# Patient Record
Sex: Male | Born: 1985 | Race: White | Hispanic: No | Marital: Married | Smoking: Never smoker
Health system: Southern US, Community
[De-identification: ages and names within clinical notes are randomized; demographics above are authoritative.]

## PROBLEM LIST (undated history)

## (undated) DIAGNOSIS — F419 Anxiety disorder, unspecified: Secondary | ICD-10-CM

## (undated) HISTORY — PX: WISDOM TOOTH EXTRACTION: SHX21

## (undated) HISTORY — PX: APPENDECTOMY: SHX54

## (undated) HISTORY — PX: HAND SURGERY: SHX662

---

## 2004-08-08 ENCOUNTER — Emergency Department: Payer: Self-pay | Admitting: Emergency Medicine

## 2004-08-09 ENCOUNTER — Other Ambulatory Visit: Payer: Self-pay

## 2007-05-09 ENCOUNTER — Emergency Department: Payer: Self-pay | Admitting: Emergency Medicine

## 2007-05-09 ENCOUNTER — Other Ambulatory Visit: Payer: Self-pay

## 2007-06-11 ENCOUNTER — Emergency Department: Payer: Self-pay | Admitting: Unknown Physician Specialty

## 2007-06-11 ENCOUNTER — Other Ambulatory Visit: Payer: Self-pay

## 2007-06-18 ENCOUNTER — Ambulatory Visit: Payer: Self-pay | Admitting: Gastroenterology

## 2007-06-19 ENCOUNTER — Ambulatory Visit: Payer: Self-pay | Admitting: Gastroenterology

## 2007-08-18 ENCOUNTER — Ambulatory Visit: Payer: Self-pay | Admitting: Otolaryngology

## 2016-08-03 ENCOUNTER — Other Ambulatory Visit: Payer: Self-pay | Admitting: Orthopedic Surgery

## 2016-08-03 DIAGNOSIS — M25512 Pain in left shoulder: Secondary | ICD-10-CM

## 2016-08-03 DIAGNOSIS — M7542 Impingement syndrome of left shoulder: Secondary | ICD-10-CM

## 2016-08-03 DIAGNOSIS — G8929 Other chronic pain: Secondary | ICD-10-CM

## 2017-08-12 ENCOUNTER — Encounter: Payer: Self-pay | Admitting: Emergency Medicine

## 2017-08-12 ENCOUNTER — Emergency Department: Payer: BLUE CROSS/BLUE SHIELD

## 2017-08-12 ENCOUNTER — Emergency Department
Admission: EM | Admit: 2017-08-12 | Discharge: 2017-08-12 | Disposition: A | Discharge status: Home / Self Care | Payer: BLUE CROSS/BLUE SHIELD | Attending: Emergency Medicine | Admitting: Emergency Medicine

## 2017-08-12 DIAGNOSIS — Y999 Unspecified external cause status: Secondary | ICD-10-CM | POA: Diagnosis not present

## 2017-08-12 DIAGNOSIS — Y929 Unspecified place or not applicable: Secondary | ICD-10-CM | POA: Diagnosis not present

## 2017-08-12 DIAGNOSIS — W0110XA Fall on same level from slipping, tripping and stumbling with subsequent striking against unspecified object, initial encounter: Secondary | ICD-10-CM | POA: Insufficient documentation

## 2017-08-12 DIAGNOSIS — S0990XA Unspecified injury of head, initial encounter: Secondary | ICD-10-CM

## 2017-08-12 DIAGNOSIS — Y939 Activity, unspecified: Secondary | ICD-10-CM | POA: Diagnosis not present

## 2017-08-12 MED ORDER — NAPROXEN 500 MG PO TABS: 500.0000 mg | ORAL_TABLET | Freq: Two times a day (BID) | ORAL | Status: DC

## 2017-08-12 MED ORDER — TRAMADOL HCL 50 MG PO TABS
50.0000 mg | ORAL_TABLET | Freq: Once | ORAL | Status: AC
  Administered 2017-08-12: 50 mg via ORAL
  Filled 2017-08-12: qty 1

## 2017-08-12 MED ORDER — NAPROXEN 500 MG PO TABS
500.0000 mg | ORAL_TABLET | Freq: Once | ORAL | Status: AC
  Administered 2017-08-12: 500 mg via ORAL
  Filled 2017-08-12: qty 1

## 2017-08-12 MED ORDER — BACITRACIN ZINC 500 UNIT/GM EX OINT
TOPICAL_OINTMENT | Freq: Once | CUTANEOUS | Status: AC
Start: 2017-08-12 — End: 2017-08-12
  Administered 2017-08-12: 1 via TOPICAL
  Filled 2017-08-12: qty 1.8

## 2017-08-12 NOTE — ED Triage Notes (Signed)
Pt reports slipped down his steps this am and hit the back of his head and right arm. Pt states it was 2 steps that he slipped down. Pt with abrasion to right arm. No obvious injury to head. Pt denies LOC.

## 2017-08-12 NOTE — ED Notes (Signed)
Bacitracin applied to right forearm and sterile dressing placed over.

## 2017-08-12 NOTE — ED Provider Notes (Signed)
[facility] Emergency Department Provider Note   ____________________________________________   First MD Initiated Contact with Patient 08/12/17 1003     (approximate)  I have reviewed the triage vital signs and the nursing notes.   HISTORY  Chief Complaint Head Injury; Arm Injury; and Fall    HPI Juan Berry is a 32 y.o. male patient slipped and fell and hit the back of his head.  Patient also complained of right arm pain secondary to fall.  Patient denies LOC.  In triage patient denies headache.  After arriving to the exam room he states he has an increase in posterior  Headache posterior neck pain.  Patient denies vision disturbance or vertigo.  Patient rates his pain as a 2/10.  History reviewed. No pertinent past medical history.  There are no active problems to display for this patient.   History reviewed. No pertinent surgical history.  Prior to Admission medications   Medication Sig Start Date End Date Taking? Authorizing Provider  clonazePAM (KLONOPIN) 0.5 MG tablet Take 0.5 mg by mouth 2 (two) times daily as needed for anxiety.   Yes [provider]  naproxen (NAPROSYN) 500 MG tablet Take 1 tablet (500 mg total) by mouth 2 (two) times daily with a meal. 08/12/17   Joni Reining, PA-C    Allergies Patient has no known allergies.  No family history on file.  Social History Social History   Tobacco Use  . Smoking status: Not on file  Substance Use Topics  . Alcohol use: Not on file  . Drug use: Not on file    Review of Systems Constitutional: No fever/chills Eyes: No visual changes. ENT: No sore throat. Cardiovascular: Denies chest pain. Respiratory: Denies shortness of breath. Gastrointestinal: No abdominal pain.  No nausea, no vomiting.  No diarrhea.  No constipation. Genitourinary: Negative for dysuria. Musculoskeletal: Right forearm pain. Skin: Negative for rash.  Abrasion to right forearm. Neurological:  Positive for headaches, but denies focal weakness or numbness.   ____________________________________________   PHYSICAL EXAM:  VITAL SIGNS: ED Triage Vitals  Enc Vitals Group     BP 08/12/17 0913 124/78     Pulse Rate 08/12/17 0913 74     Resp 08/12/17 0913 20     Temp 08/12/17 0913 98 F (36.7 C)     Temp Source 08/12/17 0913 Oral     SpO2 08/12/17 0915 100 %     Weight 08/12/17 0915 210 lb (95.3 kg)     Height 08/12/17 0915 6' (1.829 m)     Head Circumference --      Peak Flow --      Pain Score 08/12/17 0915 2     Pain Loc --      Pain Edu? --      Excl. in GC? --    Constitutional: Alert and oriented. Well appearing and in no acute distress. Eyes: Conjunctivae are normal. PERRL. EOMI. Head: Atraumatic. Neck: No stridor.  No cervical spine tenderness to palpation. Hematological/Lymphatic/Immunilogical: No cervical lymphadenopathy. Cardiovascular: Normal rate, regular rhythm. Grossly normal heart sounds.  Good peripheral circulation. Respiratory: Normal respiratory effort.  No retractions. Lungs CTAB. Gastrointestinal: Soft and nontender. No distention. No abdominal bruits. No CVA tenderness. Musculoskeletal: No obvious deformities of the right upper extremity.  Moderate guarding palpation mid shaft for the right forearm. Neurologic:  Normal speech and language. No gross focal neurologic deficits are appreciated. No gait instability. Skin:  Skin is warm, dry and intact. No rash  noted.  Abrasion right forearm. Psychiatric: Mood and affect are normal. Speech and behavior are normal.  ____________________________________________   LABS (all labs ordered are listed, but only abnormal results are displayed)  Labs Reviewed - No data to display ____________________________________________  EKG   ____________________________________________  RADIOLOGY  No acute findings on CT of the head and neck.  No acute findings on x-ray of the right forearm.  Official  radiology report(s): Dg Forearm Right  Result Date: 08/12/2017 CLINICAL DATA:  Right forearm abrasion after fall. EXAM: RIGHT FOREARM - 2 VIEW COMPARISON:  None. FINDINGS: There is no evidence of fracture or other focal bone lesions. Soft tissues are unremarkable. IMPRESSION: Negative. Electronically Signed   By: Obie Dredge M.D.   On: 08/12/2017 10:54   Ct Head Wo Contrast  Result Date: 08/12/2017 CLINICAL DATA:  Fall down steps with headaches and neck pain, initial encounter EXAM: CT HEAD WITHOUT CONTRAST CT CERVICAL SPINE WITHOUT CONTRAST TECHNIQUE: Multidetector CT imaging of the head and cervical spine was performed following the standard protocol without intravenous contrast. Multiplanar CT image reconstructions of the cervical spine were also generated. COMPARISON:  06/11/2007 FINDINGS: CT HEAD FINDINGS Brain: No evidence of acute infarction, hemorrhage, hydrocephalus, extra-axial collection or mass lesion/mass effect. Vascular: No hyperdense vessel or unexpected calcification. Skull: Normal. Negative for fracture or focal lesion. Sinuses/Orbits: No acute finding. Other: None. CT CERVICAL SPINE FINDINGS Alignment: Within normal limits. Skull base and vertebrae: 7 cervical segments are well visualized. Vertebral body height is well maintained. No acute fracture or acute facet abnormality is identified. Soft tissues and spinal canal: No soft tissue abnormality is identified. Upper chest: Within normal limits. Other: None IMPRESSION: CT of the head: Normal head CT. CT of the cervical spine: No acute abnormality noted. Electronically Signed   By: Alcide Clever M.D.   On: 08/12/2017 11:04   Ct Cervical Spine Wo Contrast  Result Date: 08/12/2017 CLINICAL DATA:  Fall down steps with headaches and neck pain, initial encounter EXAM: CT HEAD WITHOUT CONTRAST CT CERVICAL SPINE WITHOUT CONTRAST TECHNIQUE: Multidetector CT imaging of the head and cervical spine was performed following the standard protocol  without intravenous contrast. Multiplanar CT image reconstructions of the cervical spine were also generated. COMPARISON:  06/11/2007 FINDINGS: CT HEAD FINDINGS Brain: No evidence of acute infarction, hemorrhage, hydrocephalus, extra-axial collection or mass lesion/mass effect. Vascular: No hyperdense vessel or unexpected calcification. Skull: Normal. Negative for fracture or focal lesion. Sinuses/Orbits: No acute finding. Other: None. CT CERVICAL SPINE FINDINGS Alignment: Within normal limits. Skull base and vertebrae: 7 cervical segments are well visualized. Vertebral body height is well maintained. No acute fracture or acute facet abnormality is identified. Soft tissues and spinal canal: No soft tissue abnormality is identified. Upper chest: Within normal limits. Other: None IMPRESSION: CT of the head: Normal head CT. CT of the cervical spine: No acute abnormality noted. Electronically Signed   By: Alcide Clever M.D.   On: 08/12/2017 11:04    ____________________________________________   PROCEDURES  Procedure(s) performed: None  Procedures  Critical Care performed: No  ____________________________________________   INITIAL IMPRESSION / ASSESSMENT AND PLAN / ED COURSE  As part of my medical decision making, I reviewed the following data within the electronic MEDICAL RECORD NUMBER    Patient presents with headache, neck and right arm pain secondary to fall.  Discussed negative findings of imaging with patient.  Patient given discharge care instructions.  Patient advised return right ED if condition worsens.     ____________________________________________  FINAL CLINICAL IMPRESSION(S) / ED DIAGNOSES  Final diagnoses:  Minor head injury, initial encounter     ED Discharge Orders        Ordered    naproxen (NAPROSYN) 500 MG tablet  2 times daily with meals     08/12/17 1120       Note:  This document was prepared using Dragon voice recognition software and may include  unintentional dictation errors.    Joni Reining, PA-C 08/12/17 1122    Don Perking, [place], MD 08/12/17 [phone]

## 2017-08-12 NOTE — ED Triage Notes (Signed)
Pt reports pain to arm but states no head pain now.

## 2017-08-12 NOTE — ED Notes (Signed)
See triage note  States he slipped down steps  Hit head ,lower back and abrasion noted to right elbow area

## 2019-01-22 ENCOUNTER — Other Ambulatory Visit: Payer: Self-pay

## 2019-01-22 DIAGNOSIS — Z20822 Contact with and (suspected) exposure to covid-19: Secondary | ICD-10-CM

## 2019-01-24 LAB — NOVEL CORONAVIRUS, NAA: SARS-CoV-2, NAA: NOT DETECTED

## 2019-03-25 ENCOUNTER — Other Ambulatory Visit: Payer: Self-pay | Admitting: Orthopedic Surgery

## 2019-04-05 IMAGING — DX DG FOREARM 2V*R*
2 series · 2 of 2 positions shown · non-contrast
Comparison: None.

CLINICAL DATA: Right forearm abrasion after fall.

EXAM:
RIGHT FOREARM - 2 VIEW

[forearm ap]
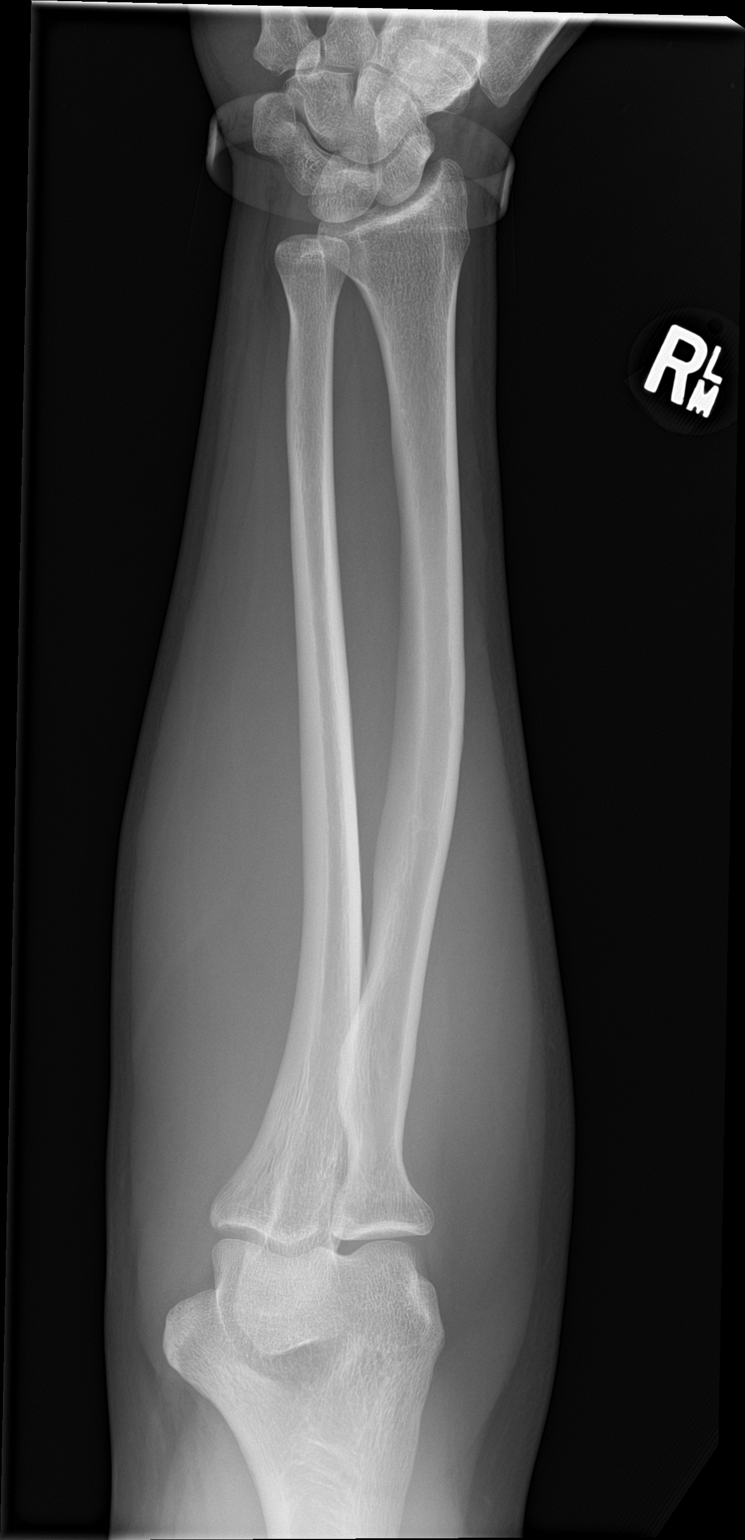

[forearm lat]
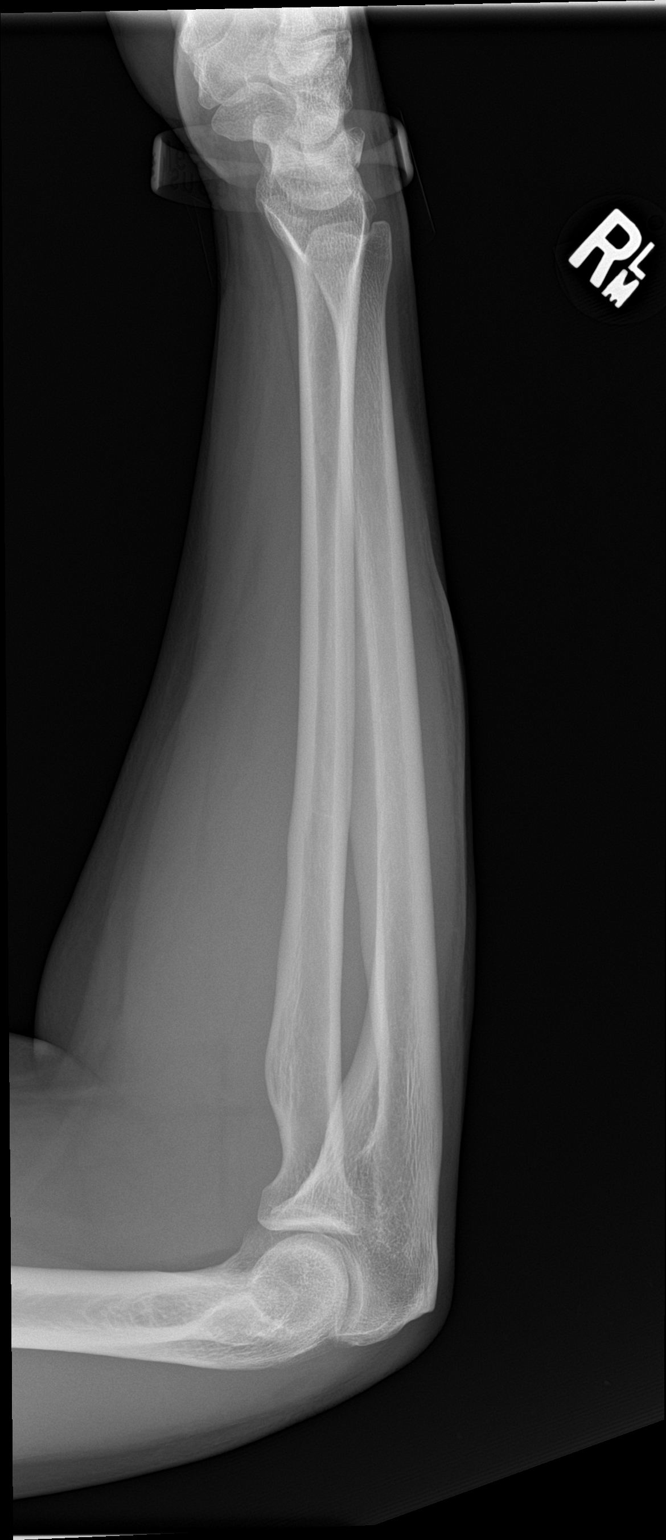

[2 of 2 positions shown; findings below may reference images not displayed]

FINDINGS: There is no evidence of fracture or other focal bone lesions. Soft
tissues are unremarkable.
IMPRESSION: Negative.

## 2019-04-05 IMAGING — CT CT CERVICAL SPINE W/O CM
4 of 7 series · 13 of 33 positions shown, 14 images · non-contrast
Comparison: 06/11/2007

CLINICAL DATA: Fall down steps with headaches and neck pain,
initial encounter

EXAM:
CT HEAD WITHOUT CONTRAST
CT CERVICAL SPINE WITHOUT CONTRAST
TECHNIQUE: Multidetector CT imaging of the head and cervical spine was
performed following the standard protocol without intravenous
contrast. Multiplanar CT image reconstructions of the cervical spine
were also generated.

[Series 7: c spine soft · axial · 0.29mm/px · z∈[-274,-194]mm · 3 of 101 slices shown]
[im 21/101  soft-tissue]
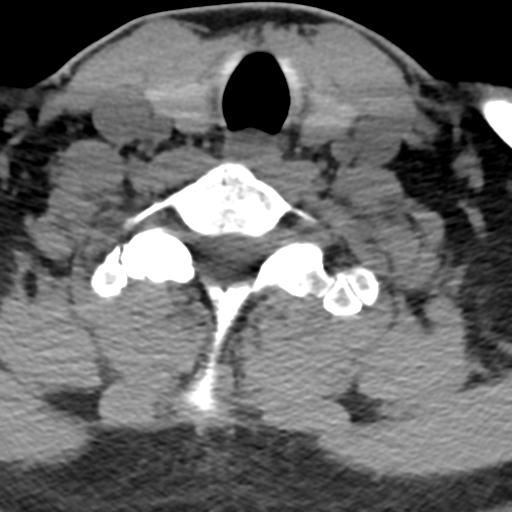
[im 41/101  soft-tissue]
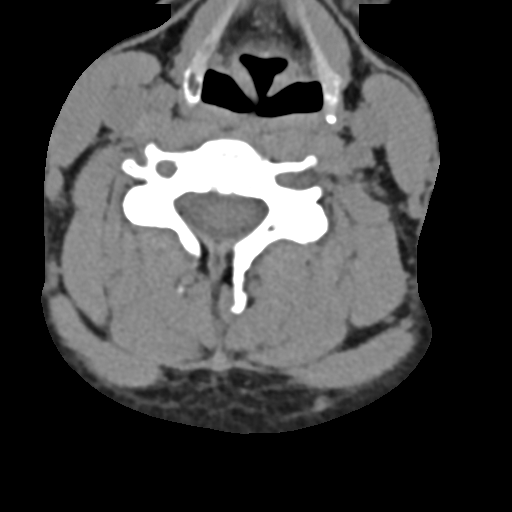
[im 61/101  soft-tissue]
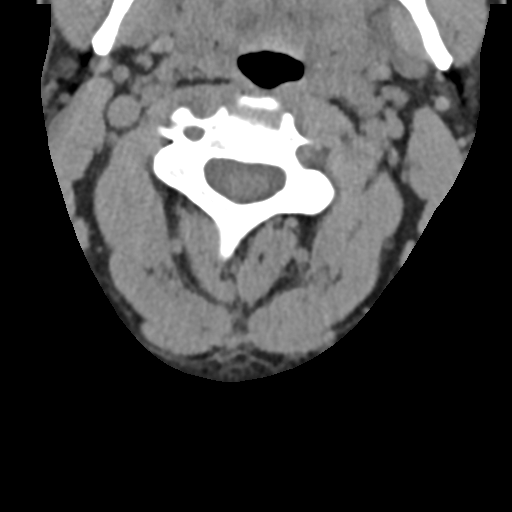

[Series 10: sagittal bone · sagittal · 0.23mm/px · 5 of 55 slices shown]
[im 10/55  bone]
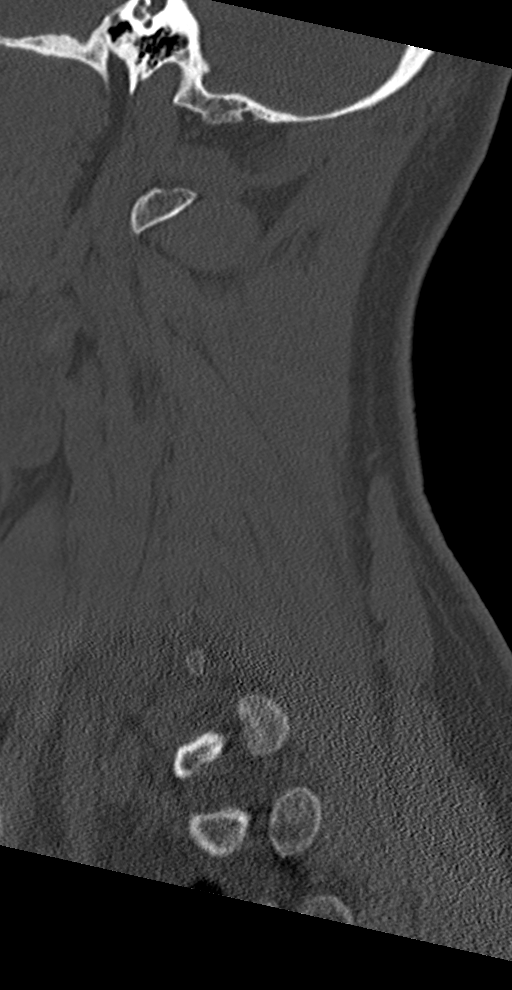
[im 19/55  bone]
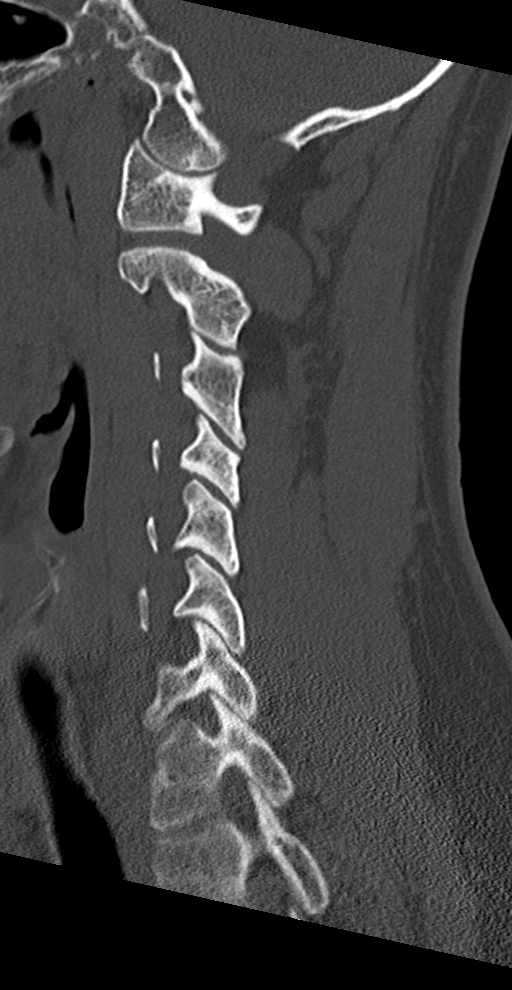
[im 28/55  bone]
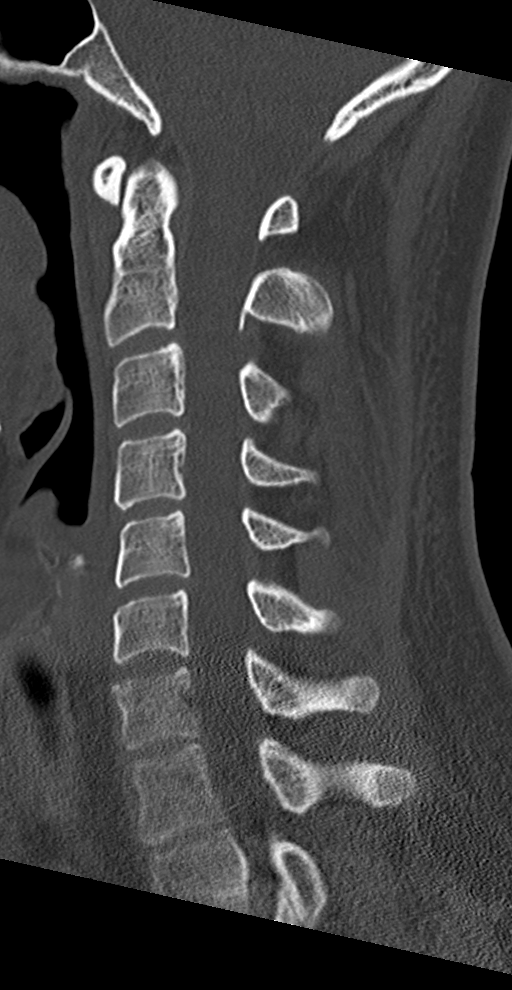
[im 37/55  bone]
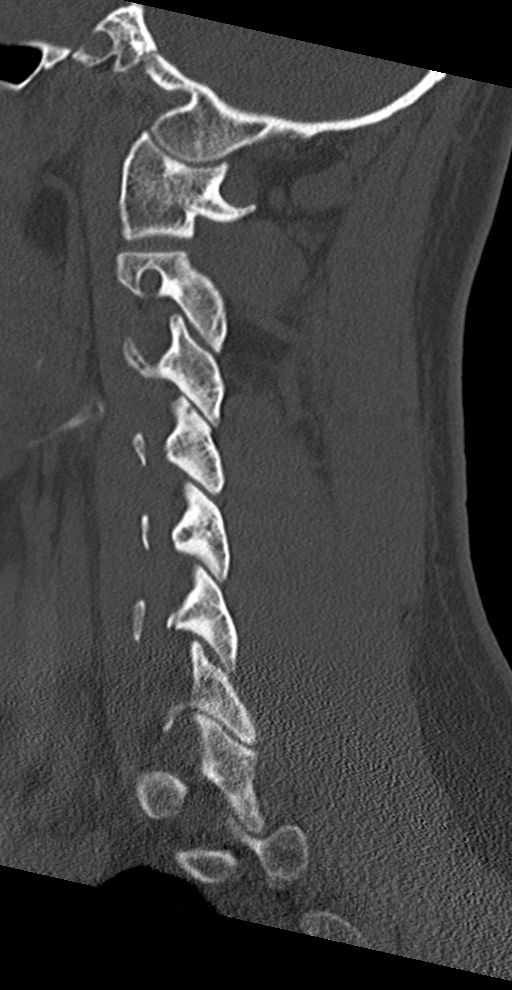
[im 46/55  bone]
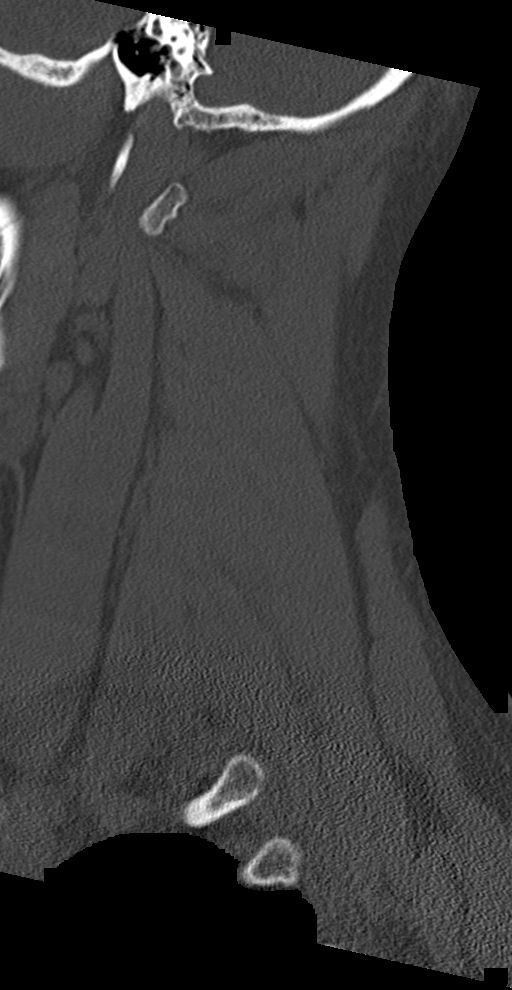

[Series 11: coronal bone · coronal · 0.25mm/px · 1 of 51 slices shown]
[im 26/51  bone]
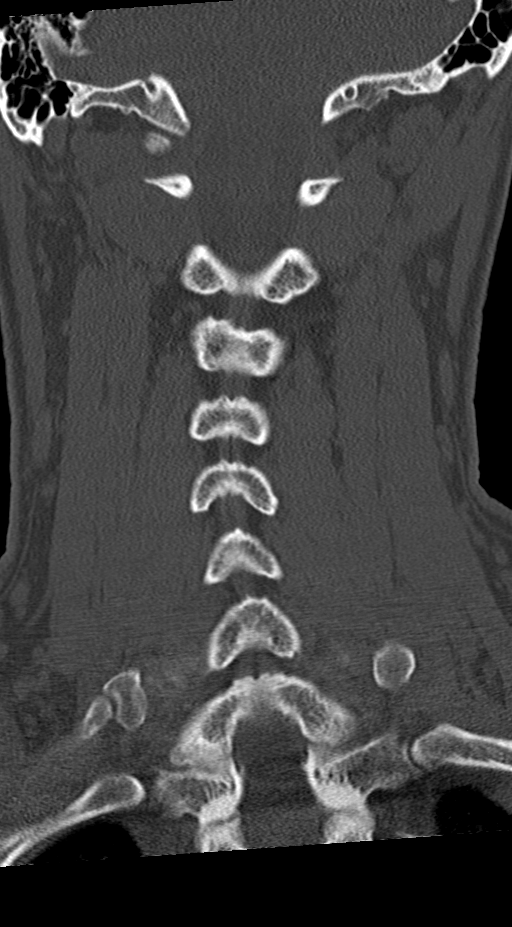

[Series 12: orthogonal bone · axial · 0.22mm/px · z∈[-299,-173]mm · 4 of 111 slices shown, 5 images]
[im 23/111  soft-tissue]
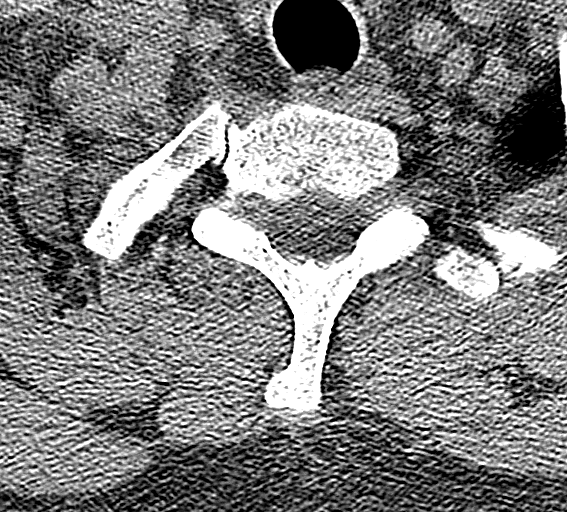
[im 23/111  bone]
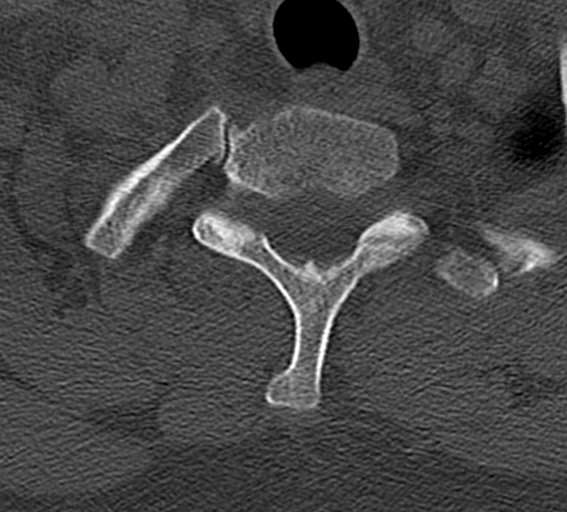
[im 45/111  bone]
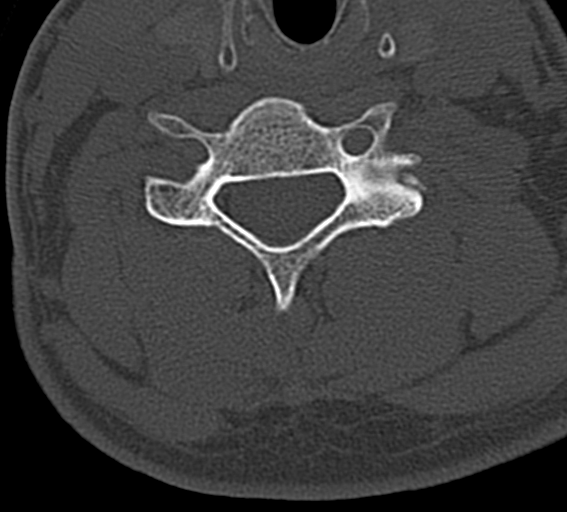
[im 67/111  bone]
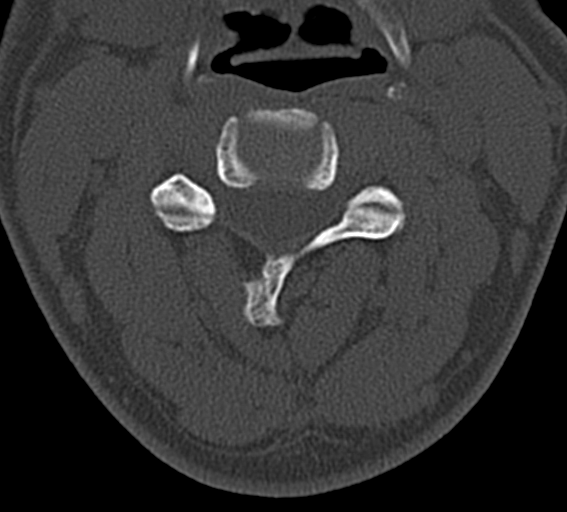
[im 89/111  bone]
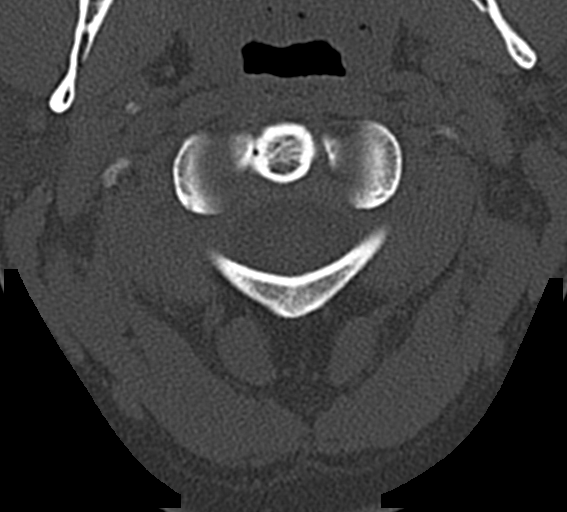

[13 of 33 positions shown; findings below may reference images not displayed]

FINDINGS: CT HEAD FINDINGS

Brain: No evidence of acute infarction, hemorrhage, hydrocephalus,
extra-axial collection or mass lesion/mass effect.

Vascular: No hyperdense vessel or unexpected calcification.

Skull: Normal. Negative for fracture or focal lesion.

Sinuses/Orbits: No acute finding.

Other: None.

CT CERVICAL SPINE FINDINGS

Alignment: Within normal limits.

Skull base and vertebrae: 7 cervical segments are well visualized.
Vertebral body height is well maintained. No acute fracture or acute
facet abnormality is identified.

Soft tissues and spinal canal: No soft tissue abnormality is
identified.

Upper chest: Within normal limits.

Other: None
IMPRESSION: CT of the head: Normal head CT.

CT of the cervical spine: No acute abnormality noted.

## 2019-04-22 ENCOUNTER — Ambulatory Visit: Payer: BC Managed Care – PPO | Attending: Internal Medicine

## 2019-04-22 DIAGNOSIS — Z20822 Contact with and (suspected) exposure to covid-19: Secondary | ICD-10-CM

## 2019-04-23 LAB — NOVEL CORONAVIRUS, NAA: SARS-CoV-2, NAA: NOT DETECTED

## 2019-05-04 ENCOUNTER — Other Ambulatory Visit: Payer: Self-pay

## 2019-05-04 ENCOUNTER — Encounter
Admission: RE | Admit: 2019-05-04 | Discharge: 2019-05-04 | Disposition: A | Discharge status: Home / Self Care | Payer: BC Managed Care – PPO | Source: Ambulatory Visit | Attending: Orthopedic Surgery | Admitting: Orthopedic Surgery

## 2019-05-04 DIAGNOSIS — Z01812 Encounter for preprocedural laboratory examination: Secondary | ICD-10-CM | POA: Diagnosis present

## 2019-05-04 DIAGNOSIS — Z20822 Contact with and (suspected) exposure to covid-19: Secondary | ICD-10-CM | POA: Diagnosis not present

## 2019-05-04 HISTORY — DX: Anxiety disorder, unspecified: F41.9

## 2019-05-04 NOTE — Patient Instructions (Signed)
Your procedure is scheduled on: 05/07/19 Report to [facility]. To find out your arrival time please call [phone] between 1PM - 3PM on 05/06/19.  Remember: Instructions that are not followed completely may result in serious medical risk, up to and including death, or upon the discretion of your surgeon and anesthesiologist your surgery may need to be rescheduled.     _X__ 1. Do not eat food after midnight the night before your procedure.                 No gum chewing or hard candies. You may drink clear liquids up to 2 hours                 before you are scheduled to arrive for your surgery- DO not drink clear                 liquids within 2 hours of the start of your surgery.                 Clear Liquids include:  water, apple juice without pulp, clear carbohydrate                 drink such as Clearfast or Gatorade, Black Coffee or Tea (Do not add                 anything to coffee or tea). Diabetics water only  __X__2.  On the morning of surgery brush your teeth with toothpaste and water, you                 may rinse your mouth with mouthwash if you wish.  Do not swallow any              toothpaste of mouthwash.     _X__ 3.  No Alcohol for 24 hours before or after surgery.   _X__ 4.  Do Not Smoke or use e-cigarettes For 24 Hours Prior to Your Surgery.                 Do not use any chewable tobacco products for at least 6 hours prior to                 surgery.  ____  5.  Bring all medications with you on the day of surgery if instructed.   __X__  6.  Notify your doctor if there is any change in your medical condition      (cold, fever, infections).     Do not wear jewelry, make-up, hairpins, clips or nail polish. Do not wear lotions, powders, or perfumes.  Do not shave 48 hours prior to surgery. Men may shave face and neck. Do not bring valuables to the hospital.    [facility] is not responsible for any belongings or  valuables.  Contacts, dentures/partials or body piercings may not be worn into surgery. Bring a case for your contacts, glasses or hearing aids, a denture cup will be supplied. Leave your suitcase in the car. After surgery it may be brought to your room. For patients admitted to the hospital, discharge time is determined by your treatment team.   Patients discharged the day of surgery will not be allowed to drive home.   Please read over the following fact sheets that you were given:   MRSA Information  __X__ Take these medicines the morning of surgery with A SIP OF WATER:  1. Clonazepam  2. Zoloft  3. Effexor  4.  5.  6.  ____ Fleet Enema (as directed)   __X__ Use CHG Soap/SAGE wipes as directed  ____ Use inhalers on the day of surgery  ____ Stop metformin/Janumet/Farxiga 2 days prior to surgery    ____ Take 1/2 of usual insulin dose the night before surgery. No insulin the morning          of surgery.   ____ Stop Blood Thinners Coumadin/Plavix/Xarelto/Pleta/Pradaxa/Eliquis/Effient/Aspirin  on   Or contact your Surgeon, Cardiologist or Medical Doctor regarding  ability to stop your blood thinners  __X__ Stop Anti-inflammatories 7 days before surgery such as Advil, Ibuprofen, Motrin,  BC or Goodies Powder, Naprosyn, Naproxen, Aleve, Aspirin    __X__ Stop all herbal supplements, fish oil or vitamin E until after surgery.    ____ Bring C-Pap to the hospital.

## 2019-05-05 ENCOUNTER — Other Ambulatory Visit
Admission: RE | Admit: 2019-05-05 | Discharge: 2019-05-05 | Disposition: A | Discharge status: Home / Self Care | Payer: BC Managed Care – PPO | Source: Ambulatory Visit | Attending: Orthopedic Surgery | Admitting: Orthopedic Surgery

## 2019-05-05 DIAGNOSIS — Z01812 Encounter for preprocedural laboratory examination: Secondary | ICD-10-CM | POA: Diagnosis not present

## 2019-05-05 LAB — CBC WITH DIFFERENTIAL/PLATELET
Abs Immature Granulocytes: 0.01 10*3/uL (ref 0.00–0.07)
Basophils Absolute: 0 10*3/uL (ref 0.0–0.1)
Basophils Relative: 0 %
Eosinophils Absolute: 0.1 10*3/uL (ref 0.0–0.5)
Eosinophils Relative: 1 %
HCT: 48.3 % (ref 39.0–52.0)
Hemoglobin: 16.2 g/dL (ref 13.0–17.0)
Immature Granulocytes: 0 %
Lymphocytes Relative: 33 %
Lymphs Abs: 1.6 10*3/uL (ref 0.7–4.0)
MCH: 29 pg (ref 26.0–34.0)
MCHC: 33.5 g/dL (ref 30.0–36.0)
MCV: 86.6 fL (ref 80.0–100.0)
Monocytes Absolute: 0.4 10*3/uL (ref 0.1–1.0)
Monocytes Relative: 8 %
Neutro Abs: 2.8 10*3/uL (ref 1.7–7.7)
Neutrophils Relative %: 58 %
Platelets: 313 10*3/uL (ref 150–400)
RBC: 5.58 MIL/uL (ref 4.22–5.81)
RDW: 12.6 % (ref 11.5–15.5)
WBC: 4.9 10*3/uL (ref 4.0–10.5)
nRBC: 0 % (ref 0.0–0.2)

## 2019-05-05 LAB — APTT: aPTT: 33 seconds (ref 24–36)

## 2019-05-05 LAB — BASIC METABOLIC PANEL
Anion gap: 10 (ref 5–15)
BUN: 9 mg/dL (ref 6–20)
CO2: 27 mmol/L (ref 22–32)
Calcium: 9.7 mg/dL (ref 8.9–10.3)
Chloride: 103 mmol/L (ref 98–111)
Creatinine, Ser: 1.11 mg/dL (ref 0.61–1.24)
GFR calc Af Amer: >60 mL/min (ref >60)
GFR calc non Af Amer: >60 mL/min (ref >60)
Glucose, Bld: 82 mg/dL (ref 70–99)
Potassium: 3.6 mmol/L (ref 3.5–5.1)
Sodium: 140 mmol/L (ref 135–145)

## 2019-05-05 LAB — PROTIME-INR
INR: 1 (ref 0.8–1.2)
Prothrombin Time: 12.8 seconds (ref 11.4–15.2)

## 2019-05-05 LAB — SARS CORONAVIRUS 2 (TAT 6-24 HRS): SARS Coronavirus 2: NEGATIVE

## 2019-05-07 ENCOUNTER — Other Ambulatory Visit: Payer: Self-pay

## 2019-05-07 ENCOUNTER — Ambulatory Visit
Admission: RE | Admit: 2019-05-07 | Discharge: 2019-05-07 | Disposition: A | Discharge status: Home / Self Care | Payer: BC Managed Care – PPO | Attending: Orthopedic Surgery | Admitting: Orthopedic Surgery

## 2019-05-07 ENCOUNTER — Ambulatory Visit: Payer: BC Managed Care – PPO | Admitting: Anesthesiology

## 2019-05-07 ENCOUNTER — Encounter: Payer: Self-pay | Admitting: Orthopedic Surgery

## 2019-05-07 ENCOUNTER — Encounter
Admission: RE | Disposition: A | Discharge status: Home / Self Care | Payer: Self-pay | Source: Home / Self Care | Attending: Orthopedic Surgery

## 2019-05-07 ENCOUNTER — Ambulatory Visit: Payer: BC Managed Care – PPO

## 2019-05-07 DIAGNOSIS — S43431A Superior glenoid labrum lesion of right shoulder, initial encounter: Secondary | ICD-10-CM | POA: Insufficient documentation

## 2019-05-07 DIAGNOSIS — F419 Anxiety disorder, unspecified: Secondary | ICD-10-CM | POA: Insufficient documentation

## 2019-05-07 DIAGNOSIS — Z419 Encounter for procedure for purposes other than remedying health state, unspecified: Secondary | ICD-10-CM

## 2019-05-07 DIAGNOSIS — X58XXXA Exposure to other specified factors, initial encounter: Secondary | ICD-10-CM | POA: Insufficient documentation

## 2019-05-07 DIAGNOSIS — Z79899 Other long term (current) drug therapy: Secondary | ICD-10-CM | POA: Diagnosis not present

## 2019-05-07 HISTORY — PX: SHOULDER ARTHROSCOPY WITH ROTATOR CUFF REPAIR AND SUBACROMIAL DECOMPRESSION: SHX5686

## 2019-05-07 SURGERY — SHOULDER ARTHROSCOPY WITH ROTATOR CUFF REPAIR AND SUBACROMIAL DECOMPRESSION
Anesthesia: General | Site: Shoulder | Laterality: Right

## 2019-05-07 MED ORDER — OXYCODONE HCL 5 MG/5ML PO SOLN: 5.0000 mg | Freq: Once | ORAL | Status: DC | PRN

## 2019-05-07 MED ORDER — FAMOTIDINE 20 MG PO TABS
20.0000 mg | ORAL_TABLET | Freq: Once | ORAL | Status: AC
  Administered 2019-05-07: 06:00:00 20 mg via ORAL

## 2019-05-07 MED ORDER — BUPIVACAINE LIPOSOME 1.3 % IJ SUSP
INTRAMUSCULAR | Status: DC | PRN
  Administered 2019-05-07: 20 mL

## 2019-05-07 MED ORDER — BUPIVACAINE HCL (PF) 0.5 % IJ SOLN
INTRAMUSCULAR | Status: AC
  Filled 2019-05-07: qty 10

## 2019-05-07 MED ORDER — ACETAMINOPHEN 10 MG/ML IV SOLN
INTRAVENOUS | Status: AC
  Filled 2019-05-07: qty 100

## 2019-05-07 MED ORDER — ROCURONIUM BROMIDE 100 MG/10ML IV SOLN
INTRAVENOUS | Status: DC | PRN
  Administered 2019-05-07 (×3): 10 mg via INTRAVENOUS
  Administered 2019-05-07: 50 mg via INTRAVENOUS

## 2019-05-07 MED ORDER — FENTANYL CITRATE (PF) 100 MCG/2ML IJ SOLN
INTRAMUSCULAR | Status: AC
  Filled 2019-05-07: qty 2

## 2019-05-07 MED ORDER — EPINEPHRINE PF 1 MG/ML IJ SOLN
INTRAMUSCULAR | Status: AC
  Filled 2019-05-07: qty 4

## 2019-05-07 MED ORDER — PROPOFOL 10 MG/ML IV BOLUS
INTRAVENOUS | Status: DC | PRN
  Administered 2019-05-07: 40 mg via INTRAVENOUS
  Administered 2019-05-07: 160 mg via INTRAVENOUS

## 2019-05-07 MED ORDER — ONDANSETRON HCL 4 MG/2ML IJ SOLN: 4.0000 mg | Freq: Once | INTRAMUSCULAR | Status: DC | PRN

## 2019-05-07 MED ORDER — MIDAZOLAM HCL 2 MG/2ML IJ SOLN
INTRAMUSCULAR | Status: AC
  Filled 2019-05-07: qty 2

## 2019-05-07 MED ORDER — OXYCODONE HCL 5 MG PO TABS: 5.0000 mg | ORAL_TABLET | ORAL | 0 refills | Status: AC | PRN

## 2019-05-07 MED ORDER — CHLORHEXIDINE GLUCONATE CLOTH 2 % EX PADS
6.0000 | MEDICATED_PAD | Freq: Once | CUTANEOUS | Status: AC
  Administered 2019-05-07: 07:00:00 6 via TOPICAL

## 2019-05-07 MED ORDER — MIDAZOLAM HCL 2 MG/2ML IJ SOLN
INTRAMUSCULAR | Status: AC
  Administered 2019-05-07: 1 mg via INTRAVENOUS
  Filled 2019-05-07: qty 2

## 2019-05-07 MED ORDER — EPHEDRINE SULFATE 50 MG/ML IJ SOLN
INTRAMUSCULAR | Status: DC | PRN
  Administered 2019-05-07 (×2): 10 mg via INTRAVENOUS

## 2019-05-07 MED ORDER — FENTANYL CITRATE (PF) 100 MCG/2ML IJ SOLN
INTRAMUSCULAR | Status: AC
  Administered 2019-05-07: 07:00:00 50 ug via INTRAVENOUS
  Filled 2019-05-07: qty 2

## 2019-05-07 MED ORDER — PROPOFOL 10 MG/ML IV BOLUS
INTRAVENOUS | Status: AC
  Filled 2019-05-07: qty 20

## 2019-05-07 MED ORDER — PHENYLEPHRINE HCL (PRESSORS) 10 MG/ML IV SOLN
INTRAVENOUS | Status: DC | PRN
  Administered 2019-05-07: 100 ug via INTRAVENOUS
  Administered 2019-05-07: 180 ug via INTRAVENOUS
  Administered 2019-05-07 (×3): 100 ug via INTRAVENOUS

## 2019-05-07 MED ORDER — LIDOCAINE HCL (CARDIAC) PF 100 MG/5ML IV SOSY
PREFILLED_SYRINGE | INTRAVENOUS | Status: DC | PRN
  Administered 2019-05-07: 60 mg via INTRAVENOUS

## 2019-05-07 MED ORDER — MIDAZOLAM HCL 2 MG/2ML IJ SOLN
1.0000 mg | INTRAMUSCULAR | Status: AC | PRN
  Administered 2019-05-07: 08:00:00 1 mg via INTRAVENOUS

## 2019-05-07 MED ORDER — FENTANYL CITRATE (PF) 100 MCG/2ML IJ SOLN
INTRAMUSCULAR | Status: DC | PRN
  Administered 2019-05-07: 25 ug via INTRAVENOUS
  Administered 2019-05-07 (×2): 50 ug via INTRAVENOUS

## 2019-05-07 MED ORDER — SUGAMMADEX SODIUM 200 MG/2ML IV SOLN
INTRAVENOUS | Status: AC
  Filled 2019-05-07: qty 2

## 2019-05-07 MED ORDER — FENTANYL CITRATE (PF) 100 MCG/2ML IJ SOLN: 25.0000 ug | INTRAMUSCULAR | Status: DC | PRN

## 2019-05-07 MED ORDER — LACTATED RINGERS IV SOLN
INTRAVENOUS | Status: DC | PRN
  Administered 2019-05-07: 4 mL

## 2019-05-07 MED ORDER — BUPIVACAINE LIPOSOME 1.3 % IJ SUSP
INTRAMUSCULAR | Status: AC
  Filled 2019-05-07: qty 20

## 2019-05-07 MED ORDER — FENTANYL CITRATE (PF) 100 MCG/2ML IJ SOLN: 50.0000 ug | INTRAMUSCULAR | Status: DC | PRN

## 2019-05-07 MED ORDER — CEFAZOLIN SODIUM-DEXTROSE 2-4 GM/100ML-% IV SOLN
2.0000 g | INTRAVENOUS | Status: AC
  Administered 2019-05-07: 2 g via INTRAVENOUS

## 2019-05-07 MED ORDER — ONDANSETRON HCL 4 MG/2ML IJ SOLN
INTRAMUSCULAR | Status: DC | PRN
  Administered 2019-05-07: 4 mg via INTRAVENOUS

## 2019-05-07 MED ORDER — OXYCODONE HCL 5 MG PO TABS: 5.0000 mg | ORAL_TABLET | Freq: Once | ORAL | Status: DC | PRN

## 2019-05-07 MED ORDER — CHLORHEXIDINE GLUCONATE CLOTH 2 % EX PADS: 6.0000 | MEDICATED_PAD | Freq: Once | CUTANEOUS | Status: DC

## 2019-05-07 MED ORDER — SUGAMMADEX SODIUM 200 MG/2ML IV SOLN
INTRAVENOUS | Status: DC | PRN
  Administered 2019-05-07: 200 mg via INTRAVENOUS

## 2019-05-07 MED ORDER — ONDANSETRON HCL 4 MG PO TABS: 4.0000 mg | ORAL_TABLET | Freq: Three times a day (TID) | ORAL | 0 refills | Status: AC | PRN

## 2019-05-07 MED ORDER — ACETAMINOPHEN 10 MG/ML IV SOLN
1000.0000 mg | Freq: Once | INTRAVENOUS | Status: DC | PRN
  Administered 2019-05-07: 1000 mg via INTRAVENOUS

## 2019-05-07 MED ORDER — LIDOCAINE HCL (PF) 1 % IJ SOLN
INTRAMUSCULAR | Status: AC
  Filled 2019-05-07: qty 30

## 2019-05-07 MED ORDER — LACTATED RINGERS IV SOLN: INTRAVENOUS | Status: DC

## 2019-05-07 MED ORDER — ROCURONIUM BROMIDE 50 MG/5ML IV SOLN
INTRAVENOUS | Status: AC
  Filled 2019-05-07: qty 1

## 2019-05-07 MED ORDER — BUPIVACAINE HCL (PF) 0.25 % IJ SOLN
INTRAMUSCULAR | Status: DC | PRN
  Administered 2019-05-07: 10 mg via EPIDURAL

## 2019-05-07 MED ORDER — SODIUM CHLORIDE (PF) 0.9 % IJ SOLN
INTRAMUSCULAR | Status: AC
  Filled 2019-05-07: qty 10

## 2019-05-07 SURGICAL SUPPLY — 69 items
ADAPTER IRRIG TUBE 2 SPIKE SOL (ADAPTER) ×6 IMPLANT
ANCHOR SUT BIOC ST 3X145 (Anchor) ×9 IMPLANT
BUR RADIUS 4.0X18.5 (BURR) ×3 IMPLANT
BUR RADIUS 5.5 (BURR) ×3 IMPLANT
CANISTER SUCT LVC 12 LTR MEDI- (MISCELLANEOUS) ×3 IMPLANT
CANNULA 5.75X7 CRYSTAL CLEAR (CANNULA) ×6 IMPLANT
CANNULA PARTIAL THREAD 2X7 (CANNULA) ×9 IMPLANT
CANNULA TWIST IN 8.25X9CM (CANNULA) ×6 IMPLANT
CLOSURE WOUND 1/2 X4 (GAUZE/BANDAGES/DRESSINGS) ×1
CONNECTOR PERFECT PASSER (CONNECTOR) ×6 IMPLANT
COOLER POLAR GLACIER W/PUMP (MISCELLANEOUS) ×3 IMPLANT
COVER WAND RF STERILE (DRAPES) ×3 IMPLANT
CRADLE LAMINECT ARM (MISCELLANEOUS) ×6 IMPLANT
DEVICE SUCT BLK HOLE OR FLOOR (MISCELLANEOUS) IMPLANT
DRAPE 3/4 80X56 (DRAPES) ×3 IMPLANT
DRAPE IMP U-DRAPE 54X76 (DRAPES) ×6 IMPLANT
DRAPE INCISE IOBAN 66X45 STRL (DRAPES) ×3 IMPLANT
DRAPE U-SHAPE 47X51 STRL (DRAPES) ×3 IMPLANT
DURAPREP 26ML APPLICATOR (WOUND CARE) ×9 IMPLANT
ELECT REM PT RETURN 9FT ADLT (ELECTROSURGICAL) ×3
ELECTRODE REM PT RTRN 9FT ADLT (ELECTROSURGICAL) ×1 IMPLANT
GAUZE SPONGE 4X4 12PLY STRL (GAUZE/BANDAGES/DRESSINGS) ×3 IMPLANT
GAUZE XEROFORM 1X8 LF (GAUZE/BANDAGES/DRESSINGS) ×3 IMPLANT
GLOVE BIOGEL PI IND STRL 9 (GLOVE) ×1 IMPLANT
GLOVE BIOGEL PI INDICATOR 9 (GLOVE) ×2
GLOVE SURG 9.0 ORTHO LTXF (GLOVE) ×9 IMPLANT
GOWN STRL REUS TWL 2XL XL LVL4 (GOWN DISPOSABLE) ×3 IMPLANT
GOWN STRL REUS W/ TWL LRG LVL3 (GOWN DISPOSABLE) ×1 IMPLANT
GOWN STRL REUS W/TWL LRG LVL3 (GOWN DISPOSABLE) ×2
IV LACTATED RINGER IRRG 3000ML (IV SOLUTION) ×12
IV LR IRRIG 3000ML ARTHROMATIC (IV SOLUTION) ×6 IMPLANT
KIT STABILIZATION SHOULDER (MISCELLANEOUS) ×3 IMPLANT
KIT SUTURE 2.8 Q-FIX DISP (MISCELLANEOUS) ×3 IMPLANT
KIT SUTURETAK 3.0 INSERT PERC (KITS) ×3 IMPLANT
KIT TURNOVER KIT A (KITS) ×3 IMPLANT
MANIFOLD NEPTUNE II (INSTRUMENTS) ×3 IMPLANT
MASK FACE SPIDER DISP (MASK) ×3 IMPLANT
MAT ABSORB  FLUID 56X50 GRAY (MISCELLANEOUS) ×4
MAT ABSORB FLUID 56X50 GRAY (MISCELLANEOUS) ×2 IMPLANT
NDL SAFETY ECLIPSE 18X1.5 (NEEDLE) ×1 IMPLANT
NEEDLE HYPO 18GX1.5 SHARP (NEEDLE) ×2
NEEDLE HYPO 22GX1.5 SAFETY (NEEDLE) ×3 IMPLANT
NS IRRIG 500ML POUR BTL (IV SOLUTION) ×3 IMPLANT
PACK ARTHROSCOPY SHOULDER (MISCELLANEOUS) ×3 IMPLANT
PAD WRAPON POLAR SHDR XLG (MISCELLANEOUS) ×1 IMPLANT
PASSER SUT CAPTURE FIRST (SUTURE) ×3 IMPLANT
SET TUBE SUCT SHAVER OUTFL 24K (TUBING) ×3 IMPLANT
SET TUBE TIP INTRA-ARTICULAR (MISCELLANEOUS) ×3 IMPLANT
STRIP CLOSURE SKIN 1/2X4 (GAUZE/BANDAGES/DRESSINGS) ×2 IMPLANT
SUT ETHILON 4-0 (SUTURE) ×2
SUT ETHILON 4-0 FS2 18XMFL BLK (SUTURE) ×1
SUT LASSO 90 DEG SD STR (SUTURE) ×3 IMPLANT
SUT MNCRL 4-0 (SUTURE) ×2
SUT MNCRL 4-0 27XMFL (SUTURE) ×1
SUT PDS AB 0 CT1 27 (SUTURE) ×9 IMPLANT
SUT PERFECTPASSER WHITE CART (SUTURE) IMPLANT
SUT SMART STITCH CARTRIDGE (SUTURE) IMPLANT
SUT VIC AB 0 CT1 36 (SUTURE) ×9 IMPLANT
SUT VIC AB 2-0 CT2 27 (SUTURE) ×3 IMPLANT
SUTURE ETHLN 4-0 FS2 18XMF BLK (SUTURE) ×1 IMPLANT
SUTURE MAGNUM WIRE 2X48 BLK (SUTURE) IMPLANT
SUTURE MNCRL 4-0 27XMF (SUTURE) ×1 IMPLANT
SYR 10ML LL (SYRINGE) ×3 IMPLANT
TAPE MICROFOAM 4IN (TAPE) ×3 IMPLANT
TUBING ARTHRO INFLOW-ONLY STRL (TUBING) ×3 IMPLANT
TUBING CONNECTING 10 (TUBING) ×2 IMPLANT
TUBING CONNECTING 10' (TUBING) ×1
WAND HAND CNTRL MULTIVAC 90 (MISCELLANEOUS) ×3 IMPLANT
WRAPON POLAR PAD SHDR XLG (MISCELLANEOUS) ×3

## 2019-05-07 NOTE — Anesthesia Postprocedure Evaluation (Signed)
Anesthesia Post Note  Patient: Juan Berry  Procedure(s) Performed: SHOULDER ARTHROSCOPY WITH ROTATOR CUFF REPAIR AND SUBACROMIAL DECOMPRESSION,DISTAL CLAVICLE EXCISION (Right Shoulder)  Patient location during evaluation: PACU Anesthesia Type: General Level of consciousness: awake and alert Pain management: pain level controlled Vital Signs Assessment: post-procedure vital signs reviewed and stable Respiratory status: spontaneous breathing, nonlabored ventilation, respiratory function stable and patient connected to nasal cannula oxygen Cardiovascular status: blood pressure returned to baseline and stable Postop Assessment: no apparent nausea or vomiting Anesthetic complications: no Comments: Patient with full sensation of operative hand. Patient understanding of possible failed block.     Last Vitals:  Vitals:   05/07/19 1140 05/07/19 1146  BP: 122/70 117/69  Pulse: (!) 103 87  Resp: 15 16  Temp:  (!) 36.3 C  SpO2: 94% 97%    Last Pain:  Vitals:   05/07/19 1146  TempSrc: Temporal  PainSc: 2                  Corinda Gubler

## 2019-05-07 NOTE — Op Note (Addendum)
05/07/2019  11:27 AM  PATIENT:  Juan Berry  34 y.o. 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    I discussed the risks and benefits of surgery. The risks include but are not limited to infection, bleeding, nerve or blood vessel injury, joint stiffness or loss of motion, persistent pain, weakness or instability, and hardware failure and the need for further surgery. Medical risks include but are not limited to DVT and pulmonary embolism, myocardial infarction, stroke, pneumonia, respiratory failure and death. Patient understood these risks and wished to proceed.   OPERATIVE IMPLANTS: Arthrex bio suturetak anchors  3   EBL: Minimal  OPERATIVE FINDINGS:  Right shoulder superior labral tear with anterior and posterior extenstion.   No rotator cuff or biceps tendon tear.  Significant narrowing of subacromial space with spurring and acromioclavicular joint arthrosis  OPERATIVE PROCEDURE:  I met with the patient in the preoperative area.  A preop history and physical was performed at the bedside.  I signed the right shoulder according the hospital's correct site of surgery protocol.  Patient was given an interscalene block by the anesthesia service with Exparel in the preoperative area.  I answered all questions by the patient. Patient was brought to the operating  room.  He underwent general endotracheal intubation.  The patient was then positioned in a beachchair position. All bony prominences were adequately padded including the lower extremities.  Examination under anesthesia revealed full passive range of motion of the right shoulder .  There was no laxity with load and shift testing, and a negative sulcus sign testing respectively.  The patient was then prepped and draped in a sterile fashion. The patient received 2 g Ancef IV prior to the onset of the case.  A timeout was performed to verify the patient's name, date of birth, medical record number, correct site of surgery and correct procedure to be performed.The timeout was also used to verify the patient received antibiotics that all appropriate instruments, implants and radiographic studies were available in the room. Once all in attendance were in agreement case began.  Bony landmarks were drawn out with a surgical marker along with proposed incisions. These were pre-injected with 1% lidocaine plain. An 11 blade was used to establish a posterior portal through which the arthroscope was placed in the glenohumeral joint. An anterior portal was established under direct visualization using an 18-gauge spinal needle for localization. A 7.0  mm arthroscopic cannula was inserted through the anterior portal. A full diagnostic examination of the glenohumeral joint was performed.  Patient had significant fraying of the superior labrum with full-thickness tear.  An anterolateral portal was established again under direct visualization using an 18-gauge spinal needle. A 7 mm cannula was placed through this anterolateral portal.  The nonarticular portion of anterior glenoid was debrided with a 4.71m resector shaver blade.   Arthrex BNIKEanchor was then placed into the superior glenoid anterior to the biceps anchor.  A 90 degree Arthrex suture lasso was then used to shuttle a single limb of this anchor under the  superior labrum.  An arthroscopic knot tying technique was then used to proximate the superior labrum to the glenoid anteriorly.  An Arthrex percutaneous kit was then used to position the drill guide over the posterior superior glenoid.  Two additional Arthrex bio Suturetak anchors were placed posterior to the biceps anchor repeating the technique detailed above.  After the third anchor was placed, the superior labral repair was probed for stability.  The superior labrum was anatomically reduced.  The biceps anchor was stable.  Final arthroscopic images of the repair were taken.  The arthroscope was then placed into the subacromial space.  A lateral portal was established.  A 90 degree ArthroCare wand and 4.0 mm resector shaver blade were used to perform an extensive bursectomy.  Then a 5.5 mm resector shaver blade was placed through the lateral portal and a subacromial decompression was performed.  Patient had a tight subacromial space and the decompression provided necessary space for the rotator cuff during abduction.  The 5.5 mm resector shaver blade was then placed to the anterior portal.  A distal clavicle excision was performed.  The patient's preoperative MRI had shown subchondral edema on both sides of the [facility] joint.  Final arthroscopic images of the distal clavicle excision and subacromial decompression were performed.  The subacromial space was copiously irrigated.  All arthroscopic instruments were removed.  The four arthroscopic portals were closed with 4-0 nylon. A dry, sterile dressing was applied to the right shoulder, along with a Polar Care sleeve. Patient's right arm was then placed in an abduction sling. The patient was awoken and brought to PACU in stable condition. I was scrubbed and present for the entire case and all sharp and instrument counts were correct at the conclusion the case. I spoke with the patient's wife in the postop consultation room to let her know the operation was  successfully performed without complication and her husband was stable in the recovery room.  I reviewed postoperative instructions with her.  I answered all her questions.    Timoteo Gaul, MD

## 2019-05-07 NOTE — H&P (Signed)
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 Symptoms of pain with overhead or abduction activity and throwing are significantly impairing activities of daily living.  He has agreed with surgical management.   Past Medical History:  Diagnosis Date  . Anxiety    Past Surgical History:  Procedure Laterality Date  . APPENDECTOMY    . HAND SURGERY    . WISDOM TOOTH EXTRACTION     Social History   Socioeconomic History  . Marital status: Married    Spouse name: Not on file  . Number of children: Not on file  . Years of education: Not on file  . Highest education level: Not on file  Occupational History  . Not on file  Tobacco Use  . Smoking status: Never Smoker  . Smokeless tobacco: Never Used  Substance and Sexual Activity  . Alcohol use: Never  . Drug use: Never  . Sexual activity: Not on file  Other Topics Concern  . Not on file  Social History Narrative  . Not on file   Social Determinants of Health   Financial Resource Strain:   . Difficulty of Paying Living Expenses: Not on file  Food Insecurity:   . Worried About Programme researcher, broadcasting/film/video in the Last Year: Not on file  . Ran Out of Food in the Last Year: Not on file  Transportation Needs:   . Lack of Transportation (Medical): Not on file  . Lack of Transportation (Non-Medical): Not on file  Physical Activity:   . Days of Exercise per Week: Not on file  . Minutes of Exercise per Session: Not on file  Stress:   . Feeling of Stress : Not on file  Social Connections:   . Frequency of Communication with Friends and Family: Not on file  . Frequency of Social Gatherings with Friends and Family: Not on file  . Attends Religious Services: Not on file  . Active Member of Clubs or Organizations: Not on file  .  Attends Banker Meetings: Not on file  . Marital Status: Not on file   History reviewed. No pertinent family history. No Known Allergies Prior to Admission medications   Medication Sig Start Date End Date Taking? Authorizing Provider  clonazePAM (KLONOPIN) 0.5 MG tablet Take 0.5 mg by mouth daily.    Yes [provider]  sertraline (ZOLOFT) 100 MG tablet Take 100 mg by mouth daily with breakfast. 04/15/19  Yes [provider]  venlafaxine XR (EFFEXOR-XR) 75 MG 24 hr capsule Take 75 mg by mouth daily with breakfast. 04/05/19  Yes [provider]     Positive ROS: All other systems have been reviewed and were otherwise negative with the exception of those mentioned in the HPI and as above.  Physical Exam: General: Alert, no acute distress Cardiovascular: Regular rate and rhythm, no murmurs rubs or gallops.  No pedal edema Respiratory: Clear to auscultation bilaterally, no wheezes rales or rhonchi. No cyanosis, no use of accessory musculature GI: No organomegaly, abdomen is soft and non-tender nondistended with positive bowel sounds. Skin: Skin intact, no lesions within the operative field. Neurologic: Sensation intact distally Psychiatric: Patient is competent for consent with normal mood and affect Lymphatic: No cervical lymphadenopathy  MUSCULOSKELETAL: Right shoulder:  Full ROM, pain in midrange of abduction, positive  O'Brien's test, positive impingement signs no rotator cuff weakness.  Patient has full digital wrist and elbow range of motion, intact sensation light touch and a palpable radial pulse.  Skin is intact.  There is no erythema ecchymosis or swelling.  There is no muscle atrophy seen.  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  I discussed the risks and benefits of surgery. The risks include but are not limited to infection, bleeding, nerve or blood vessel injury, joint stiffness or loss of motion, persistent pain, weakness or instability, retear of the labrum and hardware failure and the need for further surgery. Medical risks include but are not limited to DVT and pulmonary embolism, myocardial infarction, stroke, pneumonia, respiratory failure and death. Patient understood these risks and wished to proceed.    Thornton Park, MD   05/07/2019 7:38 AM

## 2019-05-07 NOTE — Transfer of Care (Signed)
Immediate Anesthesia Transfer of Care Note  Patient: Juan Berry  Procedure(s) Performed: SHOULDER ARTHROSCOPY WITH ROTATOR CUFF REPAIR AND SUBACROMIAL DECOMPRESSION (Right Shoulder)  Patient Location: PACU  Anesthesia Type:General  Level of Consciousness: awake, alert  and oriented  Airway & Oxygen Therapy: Patient Spontanous Breathing and Patient connected to face mask oxygen  Post-op Assessment: Report given to RN and Post -op Vital signs reviewed and stable  Post vital signs: Reviewed and stable  Last Vitals:  Vitals Value Taken Time  BP 144/50 05/07/19 1050  Temp    Pulse 64 05/07/19 1055  Resp 0 05/07/19 1054  SpO2 99 % 05/07/19 1055  Vitals shown include unvalidated device data.  Last Pain: There were no vitals filed for this visit.       Complications: No apparent anesthesia complications

## 2019-05-07 NOTE — Discharge Instructions (Addendum)
Interscalene Nerve Block, Care After This sheet gives you information about how to care for yourself after your procedure. Your health care provider may also give you more specific instructions. If you have problems or questions, contact your health care provider. What can I expect after the procedure? After the procedure, it is common to have:  Soreness or tenderness in your neck.  Numbness in your shoulder, upper arm, and some fingers.  Weakness in your shoulder and arm muscles. The feeling and strength in your shoulder, arm, and fingers should return to normal within hours after your procedure. Follow these instructions at home: For at least 24 hours after the procedure:  Do not: ? Participate in activities in which you could fall or become injured. ? Drive. ? Use heavy machinery. ? Drink alcohol. ? Take sleeping pills or medicines that cause drowsiness. ? Make important decisions or sign legal documents. ? Take care of children on your own.  Rest. Eating and drinking  If you vomit, drink water, juice, or soup when you can drink without vomiting.  Make sure you have little or no nausea before eating solid foods.  Follow the diet that is recommended by your health care provider. If you have a sling:  Wear it as told by your health care provider. Remove it only as told by your health care provider.  Loosen the sling if your fingers tingle, become numb, or turn cold and blue.  Make sure that your entire arm, including your wrist, is supported. Do not allow your wrist to dangle over the end of the sling.  Do not let your sling get wet if it is not waterproof.  Keep the sling clean. Bathing  Do not take baths, swim, or use a hot tub until your health care provider approves.  If you have a nerve block catheter in place, keep the incision site and tubing dry. Injection site care   Wash your hands with soap and water before you change your bandage (dressing). If soap and  water are not available, use hand sanitizer.  Change your dressing as told by your health care provider.  Keep your dressing dry.  Check your nerve block injection site every day for signs of infection. Check for: ? Redness, swelling, or pain. ? Fluid or blood. ? Warmth. Activity  Do not perform complex or risky activities while taking prescription pain medicine and until you have fully recovered.  Return to your normal activities as told by your health care provider and as you can tolerate them. Ask your health care provider what activities are safe for you.  Rest and take it easy. This will help you heal and recover more quickly and fully.  Be very cautious until you have regained strength and sensation. General instructions  Have a responsible adult stay with you until you are awake and alert.  Do not drive or use heavy machinery while taking prescription pain medicine and until you have fully recovered. Ask your health care provider when it is safe to drive.  Take over-the-counter and prescription medicines only as told by your health care provider.  If you smoke, do not smoke without supervision.  Do not expose your arm or shoulder to very cold or very hot temperatures until you have full feeling back.  If you have a nerve block catheter in place: ? Try to keep the catheter from getting kinked or pinched. ? Avoid pulling or tugging on the catheter.  Keep all follow-up visits as told  by your health care provider. This is important. Contact a health care provider if:  You have chills or fever.  You have redness, swelling, or pain around your injection site.  You have fluid or blood coming from the injection site.  The skin around the injection site is warm to the touch.  There is a bad smell coming from your dressing.  You have hoarseness or a drooping or dry eye that lasts more than a few days.  You have pain that is poorly controlled with the block or with pain  medicine.  You have numbness, tingling, or weakness in your shoulder or arm that lasts for more than one week. Get help right away if:  You have severe pain.  You lose or do not regain strength and sensation in your arm even after the nerve block medicine has stopped.  You have trouble breathing.  You have a nerve block catheter still in place and you begin to shiver.  You have a nerve block catheter still in place and you are getting more and more numb or weak. This information is not intended to replace advice given to you by your health care provider. Make sure you discuss any questions you have with your health care provider. Document Revised: 03/22/2017 Document Reviewed: 11/18/2015 Elsevier Patient Education  2020 [employer]   AMBULATORY SURGERY  DISCHARGE INSTRUCTIONS   1) The drugs that you were given will stay in your system until tomorrow so for the next 24 hours you should not:  A) Drive an automobile B) Make any legal decisions C) Drink any alcoholic beverage   2) You may resume regular meals tomorrow.  Today it is better to start with liquids and gradually work up to solid foods.  You may eat anything you prefer, but it is better to start with liquids, then soup and crackers, and gradually work up to solid foods.   3) Please notify your doctor immediately if you have any unusual bleeding, trouble breathing, redness and pain at the surgery site, drainage, fever, or pain not relieved by medication.    4) Additional Instructions:        Please contact your physician with any problems or Same Day Surgery at [phone], Monday through Friday 6 am to 4 pm, or [place] at [facility] number at [phone].

## 2019-05-07 NOTE — Anesthesia Procedure Notes (Signed)
Procedure Name: Intubation Date/Time: 05/07/2019 8:10 AM Performed by: Henrietta Hoover, CRNA Pre-anesthesia Checklist: Patient identified, Patient being monitored, Timeout performed, Emergency Drugs available and Suction available Patient Re-evaluated:Patient Re-evaluated prior to induction Oxygen Delivery Method: Circle system utilized Preoxygenation: Pre-oxygenation with 100% oxygen Induction Type: IV induction Ventilation: Mask ventilation without difficulty Laryngoscope Size: McGraph and 4 Grade View: Grade I Tube type: Oral Tube size: 7.5 mm Number of attempts: 1 Airway Equipment and Method: Stylet Placement Confirmation: ETT inserted through vocal cords under direct vision,  positive ETCO2 and breath sounds checked- equal and bilateral Secured at: 23 cm Tube secured with: Tape Dental Injury: Teeth and Oropharynx as per pre-operative assessment

## 2019-05-07 NOTE — Anesthesia Procedure Notes (Signed)
Anesthesia Regional Block: Interscalene brachial plexus block   Pre-Anesthetic Checklist: ,, timeout performed, Correct Patient, Correct Site, Correct Laterality, Correct Procedure, Correct Position, site marked, Risks and benefits discussed,  Surgical consent,  Pre-op evaluation,  At surgeon's request and post-op pain management  Laterality: Right  Prep: chloraprep       Needles:  Injection technique: Single-shot  Needle Type: Echogenic Needle     Needle Length: 4cm  Needle Gauge: 25     Additional Needles:   Procedures:,,,, ultrasound used (permanent image in chart),,,,  Narrative:  Start time: 05/07/2019 7:30 AM End time: 05/07/2019 7:45 AM Injection made incrementally with aspirations every 5 mL.  Performed by: Personally  Anesthesiologist: Corinda Gubler, MD  Additional Notes: Patient consented for risk and benefits of nerve block including but not limited to: nerve damage, failed block, bleeding and infection, hemidiaphragmatic paralysis and shortness of breath, Horner's syndrome.  Patient voiced understanding.  Functioning IV was confirmed and monitors were applied.  Sterile prep,hand hygiene and sterile gloves were used.  Minimal sedation used for procedure.   No paresthesia endorsed by patient during the procedure.  Negative aspiration and negative test dose prior to incremental administration of local anesthetic. The patient tolerated the procedure well with no immediate complications.  Patient's ultrasound images had difficult to discern nerve roots, no classic "traffic light" pattern appreciated. Informed patient again about possibility of failed block.

## 2019-05-07 NOTE — Anesthesia Preprocedure Evaluation (Signed)
Anesthesia Evaluation  Patient identified by MRN, date of birth, ID band Patient awake    Reviewed: Allergy & Precautions, NPO status , Patient's Chart, lab work & pertinent test results  History of Anesthesia Complications Negative for: history of anesthetic complications  Airway Mallampati: I  TM Distance: >3 FB Neck ROM: Full    Dental no notable dental hx. (+) Teeth Intact, Dental Advisory Given   Pulmonary neg pulmonary ROS, neg sleep apnea, neg COPD, Patient abstained from smoking.Not current smoker,    Pulmonary exam normal breath sounds clear to auscultation       Cardiovascular Exercise Tolerance: Good METS(-) hypertension(-) CAD and (-) Past MI negative cardio ROS  (-) dysrhythmias  Rhythm:Regular Rate:Normal - Systolic murmurs    Neuro/Psych Anxiety negative neurological ROS     GI/Hepatic neg GERD  ,(+)     (-) substance abuse  ,   Endo/Other  neg diabetes  Renal/GU negative Renal ROS     Musculoskeletal   Abdominal   Peds  Hematology   Anesthesia Other Findings Past Medical History: No date: Anxiety  Reproductive/Obstetrics                             Anesthesia Physical Anesthesia Plan  ASA: II  Anesthesia Plan: General   Post-op Pain Management:    Induction: Intravenous  PONV Risk Score and Plan: 2 and Ondansetron, Dexamethasone and Midazolam  Airway Management Planned: Oral ETT  Additional Equipment: None  Intra-op Plan:   Post-operative Plan: Extubation in OR  Informed Consent: I have reviewed the patients History and Physical, chart, labs and discussed the procedure including the risks, benefits and alternatives for the proposed anesthesia with the patient or authorized representative who has indicated his/her understanding and acceptance.     Dental advisory given  Plan Discussed with: CRNA and Surgeon  Anesthesia Plan Comments: (Discussed  risks of anesthesia with patient, including PONV, sore throat, lip/dental damage. Rare risks discussed as well, such as cardiorespiratory sequelae. Patient understands.  Discussed r/b/a of interscalene block, including elective nature. Risks discussed: - Rare: bleeding, infection, nerve damage - shortness of breath from hemidiaphragmatic paralysis - unilateral horner's syndrome - poor/non-working blocks Patient understands and agrees. )        Anesthesia Quick Evaluation

## 2019-10-01 ENCOUNTER — Ambulatory Visit: Payer: BC Managed Care – PPO | Attending: Internal Medicine

## 2019-10-01 DIAGNOSIS — Z20822 Contact with and (suspected) exposure to covid-19: Secondary | ICD-10-CM

## 2019-10-02 LAB — NOVEL CORONAVIRUS, NAA: SARS-CoV-2, NAA: NOT DETECTED

## 2019-10-02 LAB — SARS-COV-2, NAA 2 DAY TAT
